# Patient Record
Sex: Female | Born: 1960 | Race: White | Hispanic: No | Marital: Married | Smoking: Never smoker
Health system: Southern US, Community
[De-identification: ages and names within clinical notes are randomized; demographics above are authoritative.]

## PROBLEM LIST (undated history)

## (undated) HISTORY — PX: CHOLECYSTECTOMY: SHX55

## (undated) HISTORY — PX: ABDOMINAL HYSTERECTOMY: SHX81

---

## 2004-05-25 ENCOUNTER — Ambulatory Visit (HOSPITAL_BASED_OUTPATIENT_CLINIC_OR_DEPARTMENT_OTHER): Admission: RE | Admit: 2004-05-25 | Discharge: 2004-05-25 | Payer: Self-pay | Admitting: Orthopedic Surgery

## 2005-07-27 ENCOUNTER — Emergency Department (HOSPITAL_COMMUNITY): Admission: EM | Admit: 2005-07-27 | Discharge: 2005-07-27 | Payer: Self-pay | Admitting: Emergency Medicine

## 2007-01-26 IMAGING — CT CT ABDOMEN W/O CM
1 of 3 series · 15 of 32 positions shown, 19 images · IV contrast (agent unspecified)
Comparison: none

***DUPLICATE COPY for exam association in [facility] -- No change from original report, 07/31/05.***
CLINICAL DATA: Abdominal pain.  Right lower quadrant pain.  Previous appendectomy and cholecystectomy.
 ABDOMEN CT WITHOUT CONTRAST:
TECHNIQUE: Multidetector CT imaging of the abdomen was performed following the standard protocol without IV contrast.
TECHNIQUE: Multidetector CT imaging of the pelvis was performed following the standard protocol without IV contrast.

[Series 4: thin section 2.0 b40f st · axial · 0.81mm/px · z∈[-465,-27]mm · 15 of 680 slices shown, 19 images]
[im 27/680  soft-tissue]
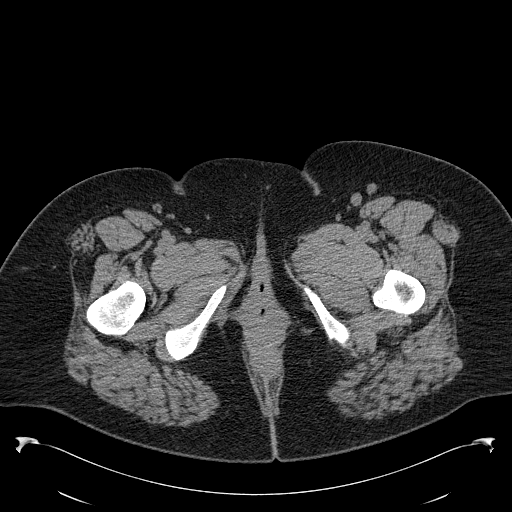
[im 27/680  bone]
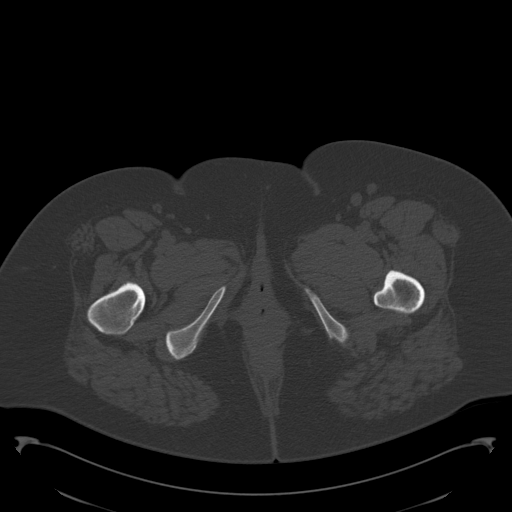
[im 79/680  soft-tissue]
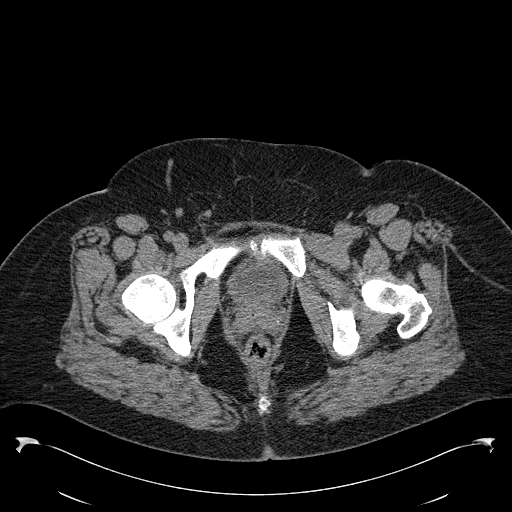
[im 131/680  soft-tissue]
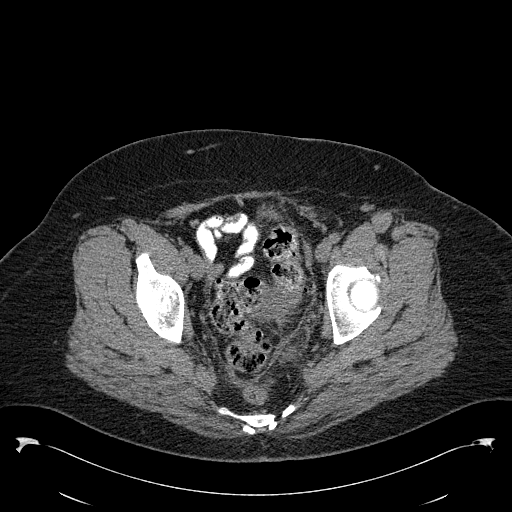
[im 183/680  soft-tissue]
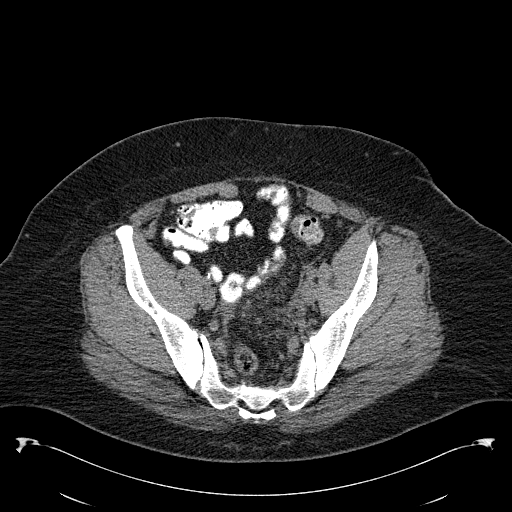
[im 236/680  soft-tissue]
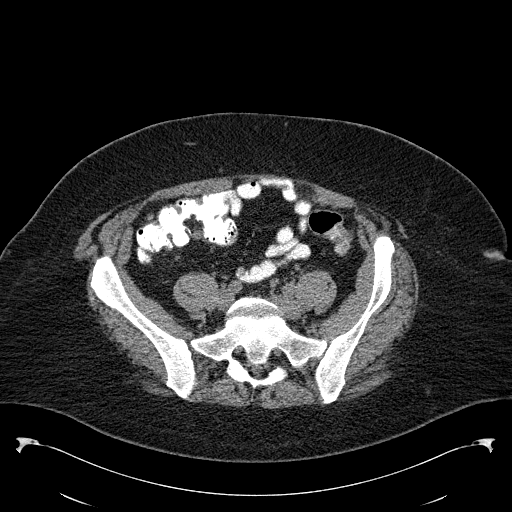
[im 288/680  soft-tissue]
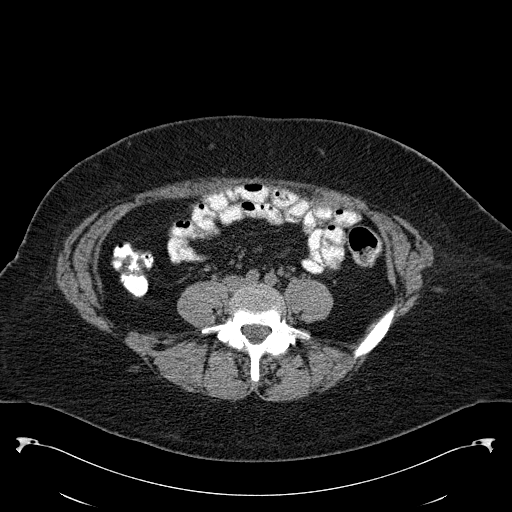
[im 340/680  soft-tissue]
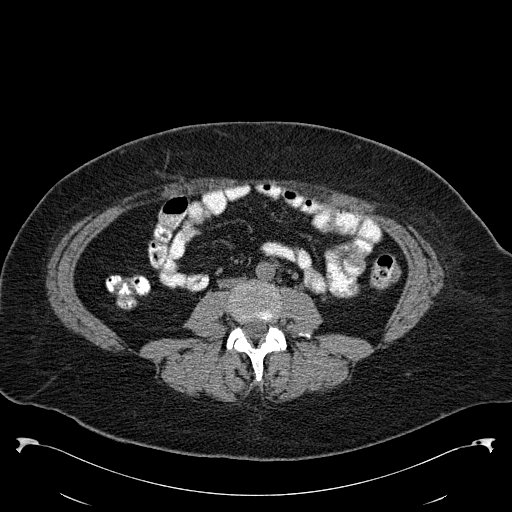
[im 392/680  soft-tissue]
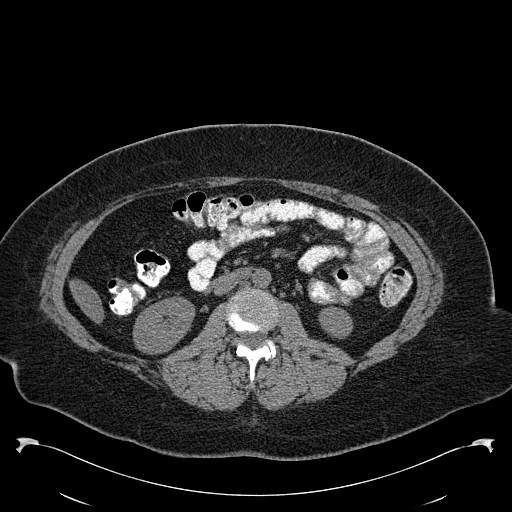
[im 444/680  soft-tissue]
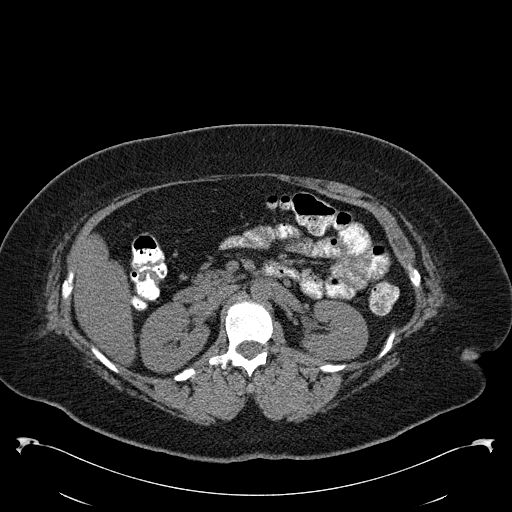
[im 444/680  bone]
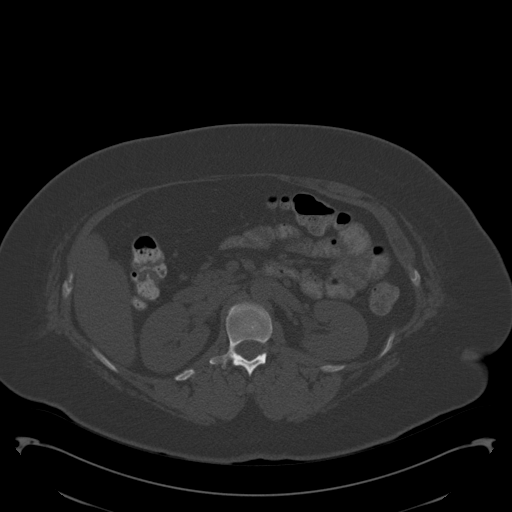
[im 497/680  soft-tissue]
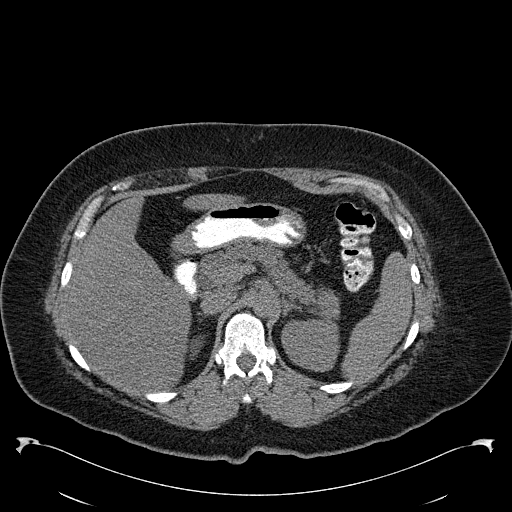
[im 549/680  soft-tissue]
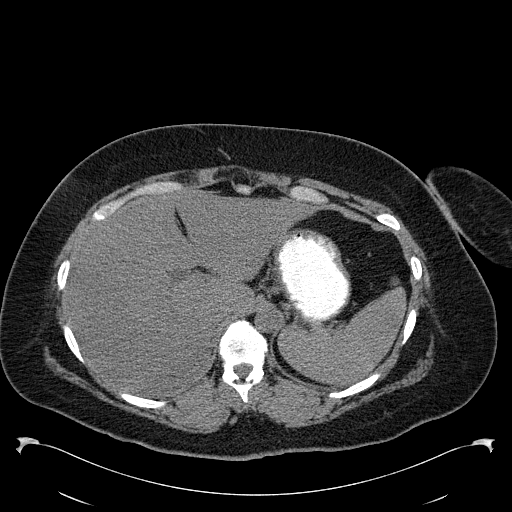
[im 575/680  lung]
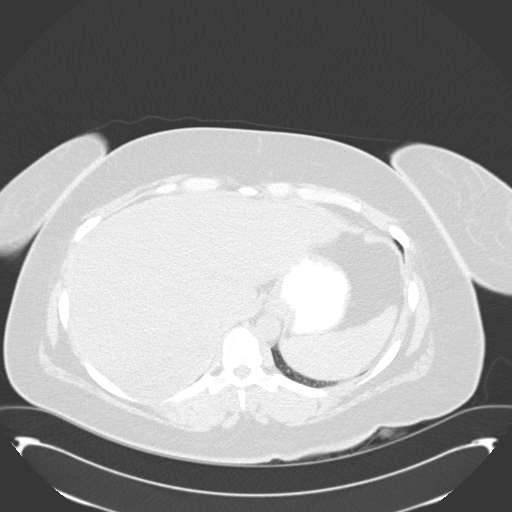
[im 601/680  soft-tissue]
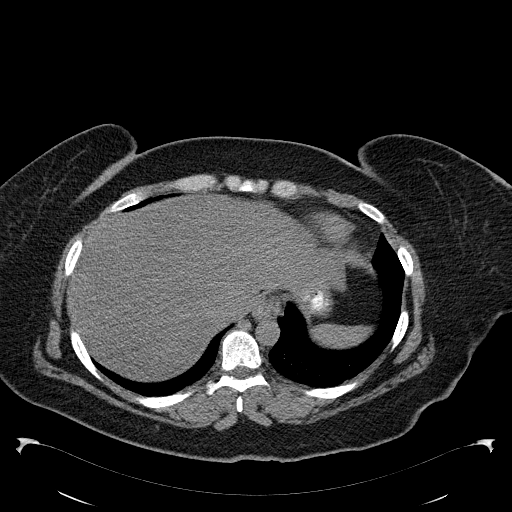
[im 601/680  lung]
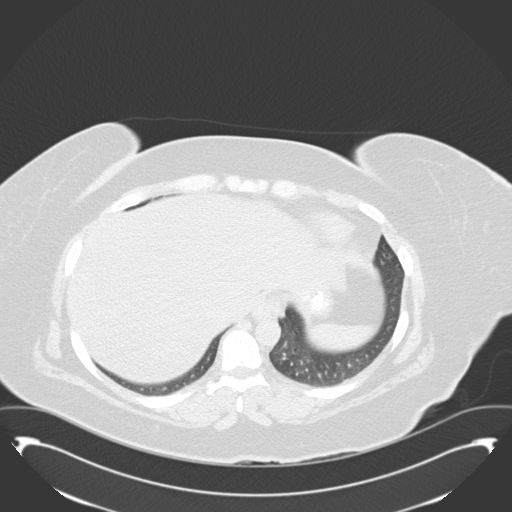
[im 627/680  lung]
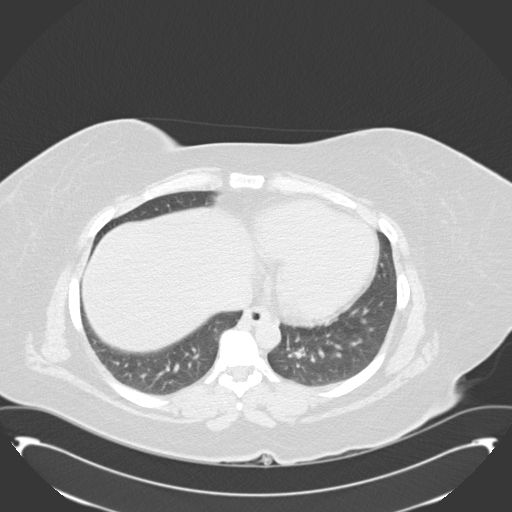
[im 653/680  soft-tissue]
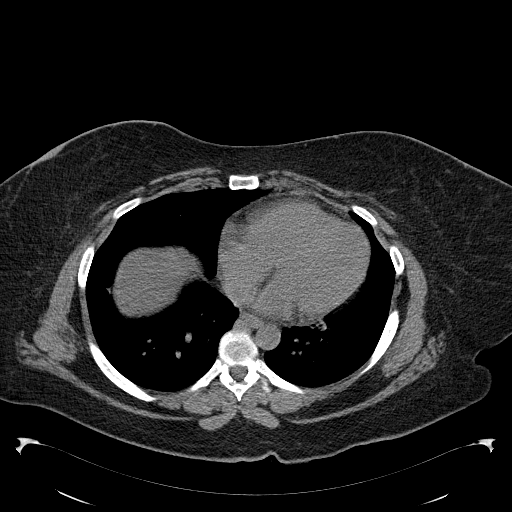
[im 653/680  lung]
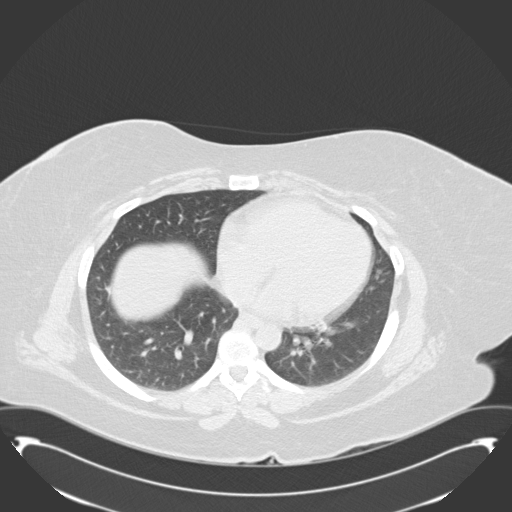

[15 of 32 positions shown; findings below may reference images not displayed]

FINDINGS: Lung bases clear.  The liver, spleen, and pancreas are within normal limits.  There is no obvious biliary ductal dilatation.  The gallbladder has been removed.  Adrenal glands and kidneys are unremarkable.  Small and large bowel appear normal.  There is no adenopathy.  Aorta and inferior vena cava are unremarkable.
IMPRESSION: Negative CT abdomen.
 PELVIS CT WITHOUT CONTRAST:
FINDINGS: Numerous diverticula are seen within the sigmoid region.  There is considerable surrounding inflammatory change consistent with acute diverticulitis of the rectosigmoid region.  I do not see a definite abscess.  There is no definite obstruction.  No pneumoperitoneum is seen.  No adenopathy is present.  No adnexal masses are seen.  No appendiceal abnormalities seen.
IMPRESSION: 1.  Sigmoid diverticulitis with moderate surrounding inflammatory change but no definite abscess.
 2.  No evidence for obstructive uropathy.

## 2011-03-11 ENCOUNTER — Inpatient Hospital Stay (INDEPENDENT_AMBULATORY_CARE_PROVIDER_SITE_OTHER)
Admission: RE | Admit: 2011-03-11 | Discharge: 2011-03-11 | Disposition: A | Discharge status: Home / Self Care | Payer: BC Managed Care – PPO | Source: Ambulatory Visit | Attending: Emergency Medicine | Admitting: Emergency Medicine

## 2011-03-11 DIAGNOSIS — I209 Angina pectoris, unspecified: Secondary | ICD-10-CM

## 2011-03-15 ENCOUNTER — Emergency Department (HOSPITAL_COMMUNITY): Payer: BC Managed Care – PPO

## 2011-03-15 ENCOUNTER — Emergency Department (HOSPITAL_COMMUNITY)
Admission: EM | Admit: 2011-03-15 | Discharge: 2011-03-16 | Disposition: A | Discharge status: Home / Self Care | Payer: BC Managed Care – PPO | Attending: Emergency Medicine | Admitting: Emergency Medicine

## 2011-03-15 DIAGNOSIS — R002 Palpitations: Secondary | ICD-10-CM | POA: Insufficient documentation

## 2011-03-15 DIAGNOSIS — R0602 Shortness of breath: Secondary | ICD-10-CM | POA: Insufficient documentation

## 2011-03-15 DIAGNOSIS — M79609 Pain in unspecified limb: Secondary | ICD-10-CM | POA: Insufficient documentation

## 2011-03-15 DIAGNOSIS — R29898 Other symptoms and signs involving the musculoskeletal system: Secondary | ICD-10-CM | POA: Insufficient documentation

## 2011-03-15 DIAGNOSIS — R209 Unspecified disturbances of skin sensation: Secondary | ICD-10-CM | POA: Insufficient documentation

## 2011-03-15 DIAGNOSIS — R079 Chest pain, unspecified: Secondary | ICD-10-CM | POA: Insufficient documentation

## 2011-03-15 DIAGNOSIS — M25476 Effusion, unspecified foot: Secondary | ICD-10-CM | POA: Insufficient documentation

## 2011-03-15 DIAGNOSIS — M25473 Effusion, unspecified ankle: Secondary | ICD-10-CM | POA: Insufficient documentation

## 2011-03-15 LAB — TROPONIN I: Troponin I: <0.3 ng/mL (ref <0.30)

## 2011-03-15 LAB — CBC
HCT: 42.9 % (ref 36.0–46.0)
MCH: 30.8 pg (ref 26.0–34.0)
MCHC: 34 g/dL (ref 30.0–36.0)
MCV: 90.5 fL (ref 78.0–100.0)
Platelets: 251 10*3/uL (ref 150–400)
RBC: 4.74 MIL/uL (ref 3.87–5.11)
RDW: 12.8 % (ref 11.5–15.5)
WBC: 11.6 10*3/uL — ABNORMAL HIGH (ref 4.0–10.5)

## 2011-03-15 LAB — BASIC METABOLIC PANEL
BUN: 14 mg/dL (ref 6–23)
CO2: 26 mEq/L (ref 19–32)
Calcium: 8.8 mg/dL (ref 8.4–10.5)
Chloride: 101 mEq/L (ref 96–112)
GFR calc Af Amer: >60 mL/min (ref >60)
GFR calc non Af Amer: >60 mL/min (ref >60)
Glucose, Bld: 115 mg/dL — ABNORMAL HIGH (ref 70–99)
Potassium: 4.1 mEq/L (ref 3.5–5.1)
Sodium: 137 mEq/L (ref 135–145)

## 2011-03-15 LAB — DIFFERENTIAL
Basophils Absolute: 0.1 10*3/uL (ref 0.0–0.1)
Basophils Relative: 1 % (ref 0–1)
Eosinophils Absolute: 0.2 10*3/uL (ref 0.0–0.7)
Eosinophils Relative: 2 % (ref 0–5)
Lymphocytes Relative: 35 % (ref 12–46)
Lymphs Abs: 4 10*3/uL (ref 0.7–4.0)
Monocytes Absolute: 1.1 10*3/uL — ABNORMAL HIGH (ref 0.1–1.0)
Monocytes Relative: 9 % (ref 3–12)
Neutro Abs: 6.2 10*3/uL (ref 1.7–7.7)

## 2011-03-15 LAB — CK TOTAL AND CKMB (NOT AT ARMC)
Relative Index: 1.7 (ref 0.0–2.5)
Total CK: 117 U/L (ref 7–177)

## 2011-03-16 LAB — TSH: TSH: 1.119 u[IU]/mL (ref 0.350–4.500)

## 2011-03-16 LAB — CK TOTAL AND CKMB (NOT AT ARMC)
CK, MB: 1.8 ng/mL (ref 0.3–4.0)
Relative Index: 1.5 (ref 0.0–2.5)
Total CK: 117 U/L (ref 7–177)

## 2011-03-16 LAB — TROPONIN I: Troponin I: <0.3 ng/mL (ref <0.30)

## 2012-09-12 IMAGING — CR DG CHEST 2V
2 series · 2 of 2 positions shown · non-contrast
Comparison: None

CLINICAL DATA: Chest pain, shortness of breath, numbness in both
arms

CHEST - 2 VIEW

[w chest pa]
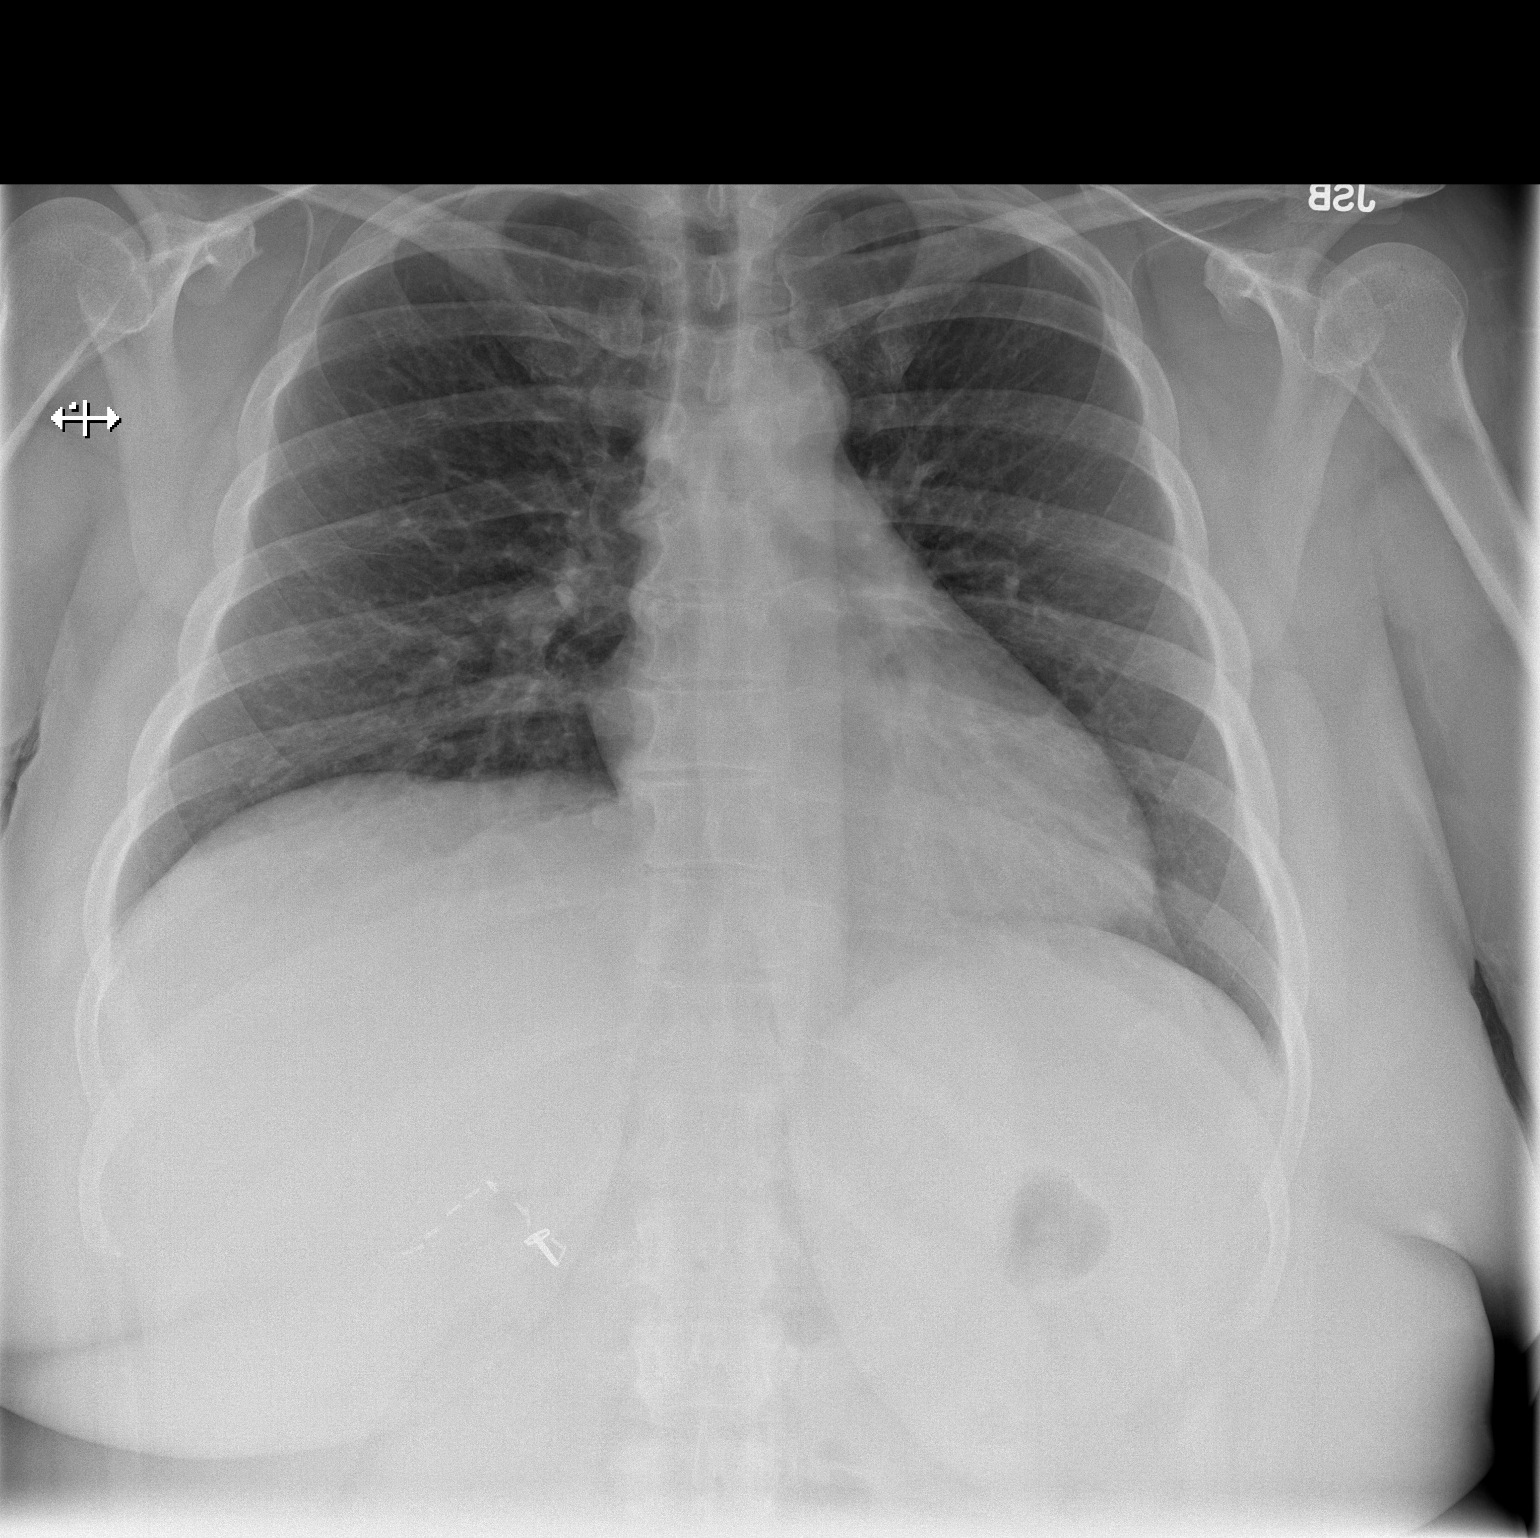

[w chest lat]
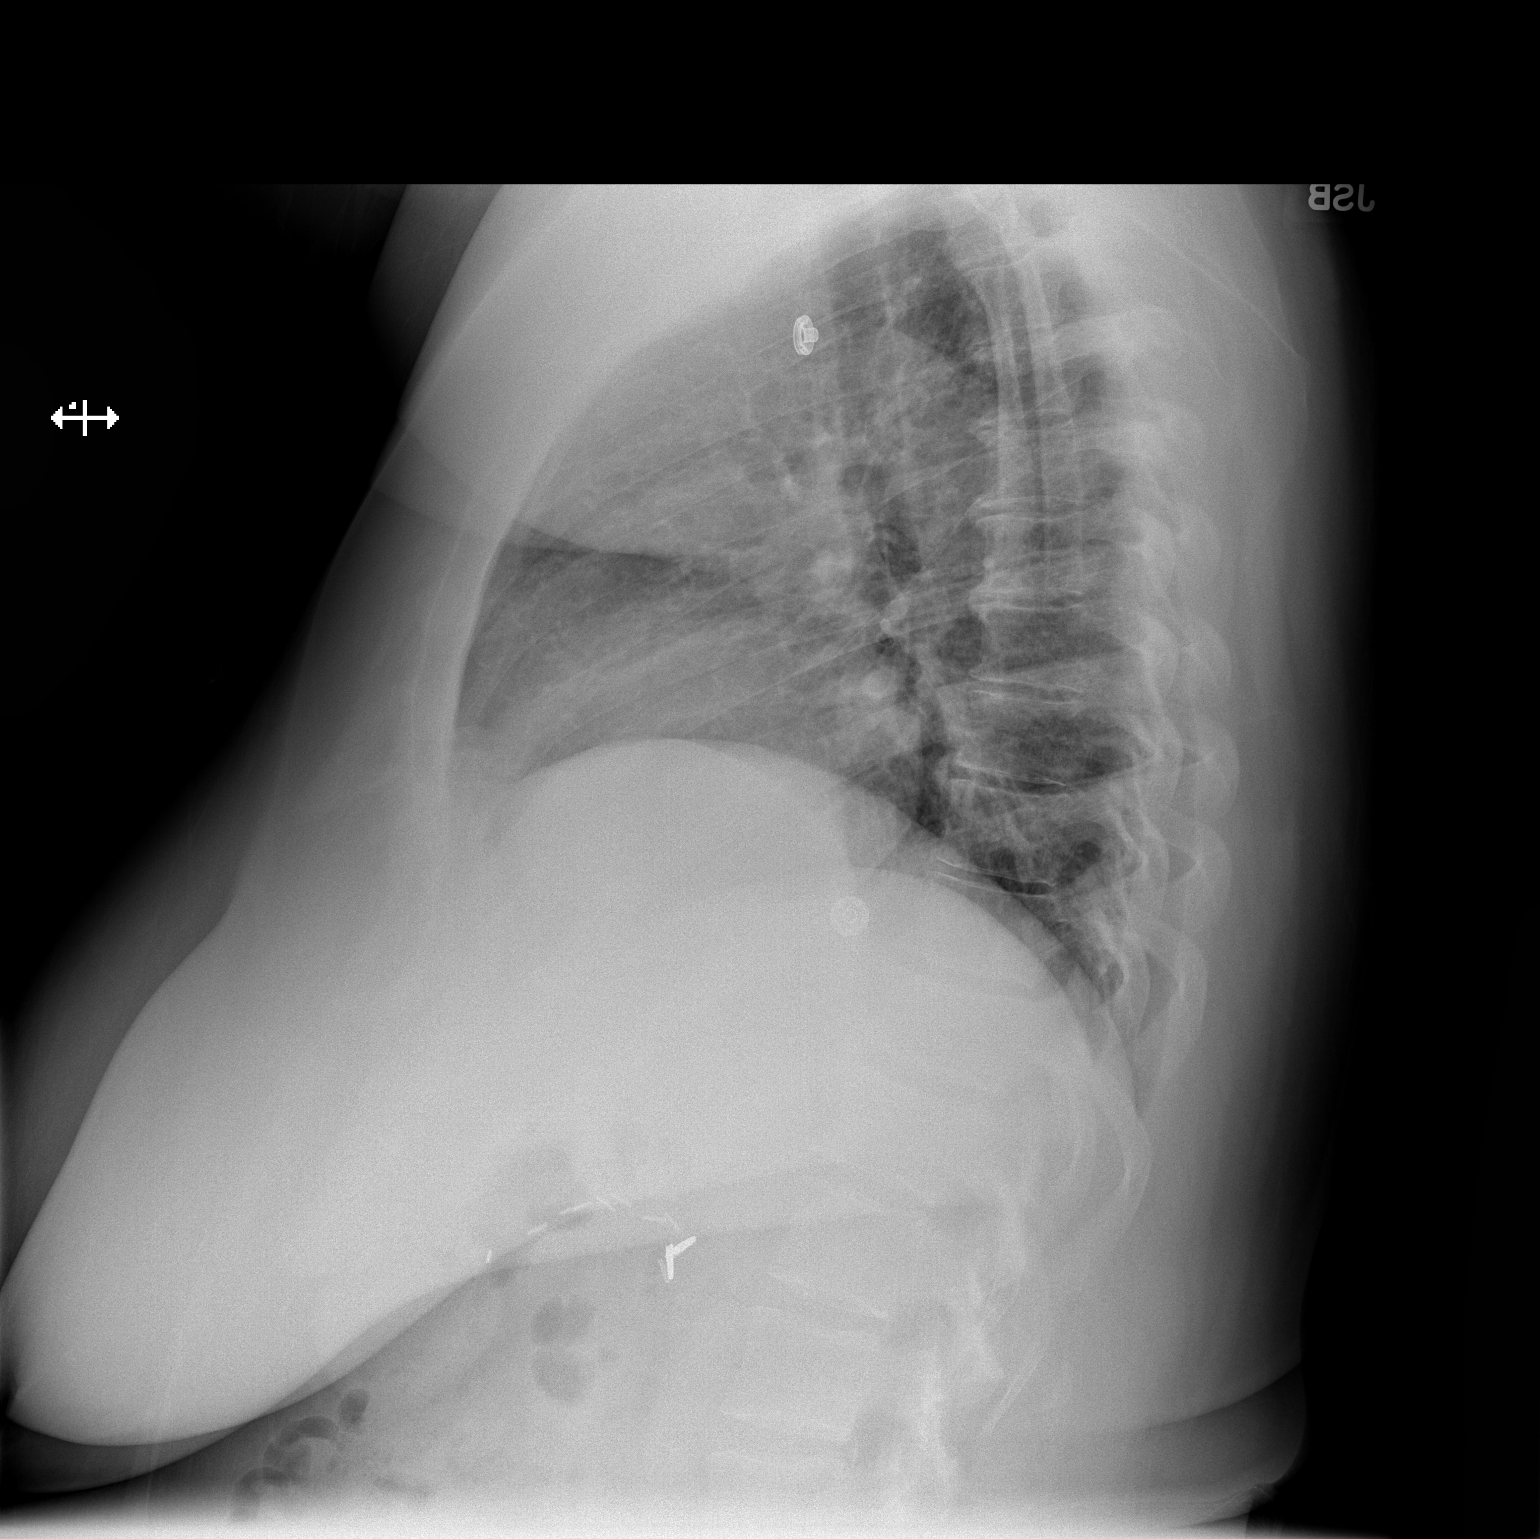

[2 of 2 positions shown; findings below may reference images not displayed]

FINDINGS: Upper-normal size of cardiac silhouette.
Mediastinal contours and pulmonary vascularity normal.
Lungs clear.
Slightly decreased lung volumes.
No pleural effusion or pneumothorax.
Scattered end plate spur formation thoracic spine.
IMPRESSION: Slightly decreased lung volumes.
No acute abnormalities otherwise seen.

## 2014-02-04 ENCOUNTER — Encounter (HOSPITAL_COMMUNITY): Payer: Self-pay | Admitting: Emergency Medicine

## 2014-02-04 ENCOUNTER — Emergency Department (INDEPENDENT_AMBULATORY_CARE_PROVIDER_SITE_OTHER)
Admission: EM | Admit: 2014-02-04 | Discharge: 2014-02-04 | Disposition: A | Discharge status: Home / Self Care | Payer: BC Managed Care – PPO | Source: Home / Self Care | Attending: Emergency Medicine | Admitting: Emergency Medicine

## 2014-02-04 DIAGNOSIS — T148 Other injury of unspecified body region: Secondary | ICD-10-CM

## 2014-02-04 DIAGNOSIS — W57XXXA Bitten or stung by nonvenomous insect and other nonvenomous arthropods, initial encounter: Secondary | ICD-10-CM

## 2014-02-04 DIAGNOSIS — L0291 Cutaneous abscess, unspecified: Secondary | ICD-10-CM

## 2014-02-04 DIAGNOSIS — L0390 Cellulitis, unspecified: Secondary | ICD-10-CM

## 2014-02-04 MED ORDER — CEPHALEXIN 500 MG PO CAPS: 500.0000 mg | ORAL_CAPSULE | Freq: Two times a day (BID) | ORAL | Status: AC

## 2014-02-04 NOTE — ED Notes (Signed)
Pt reports tick bite to right forearm onset this am 0600 Sx include localized fever, swelling, redness Denies fevers, weakness Alert w/no signs of acute distress.

## 2014-02-04 NOTE — ED Provider Notes (Signed)
CSN: [id number]     Arrival date & time 02/04/14  1806 History   First MD Initiated Contact with Patient 02/04/14 1912     Chief Complaint  Patient presents with  . Tick Removal   (Consider location/radiation/quality/duration/timing/severity/associated sxs/prior Treatment) HPI Comments: Patient pulled an embedded tick out this am. Throughout the day she noticed swelling and redness around the site. Mild discomfort. No fever or chills. No N or V.   The history is provided by the patient.    History reviewed. No pertinent past medical history. History reviewed. No pertinent past surgical history. No family history on file. History  Substance Use Topics  . Smoking status: Not on file  . Smokeless tobacco: Not on file  . Alcohol Use: Not on file   OB History   Grav Para Term Preterm Abortions TAB SAB Ect Mult Living                 Review of Systems  All other systems reviewed and are negative.   Allergies  Review of patient's allergies indicates no known allergies.  Home Medications   Prior to Admission medications   Medication Sig Start Date End Date Taking? Authorizing Provider  cephALEXin (KEFLEX) 500 MG capsule Take 1 capsule (500 mg total) by mouth 2 (two) times daily. 02/04/14 02/09/14  Riki Sheer, PA-C   There were no vitals taken for this visit. Physical Exam  Nursing note and vitals reviewed. Constitutional: She is oriented to person, place, and time. She appears well-developed and well-nourished. No distress.  Pulmonary/Chest: Effort normal.  Neurological: She is alert and oriented to person, place, and time.  Skin: Skin is warm and dry. She is not diaphoretic. There is erythema.  Right anterior forearm with small pin sized wound, no evidence of tick remains/head. Mild localized swelling, warmth and erythema approx 6cm wide along the area.   Psychiatric: Her behavior is normal.    ED Course  Procedures (including critical care time) Labs Review Labs  Reviewed - No data to display  Imaging Review No results found.   MDM   1. Tick bite   2. Cellulitis    Had been embedded. Probable inflammation, though cover for mild cellulitis with antibiotics. F/U should she have any signs of worsening infections or fevers.     Riki Sheer, PA-C 02/04/14 1926

## 2014-02-04 NOTE — Discharge Instructions (Signed)
Cellulitis Cellulitis is an infection of the skin and the tissue under the skin. The infected area is usually red and tender. This happens most often in the arms and lower legs. HOME CARE   Take your antibiotic medicine as told. Finish the medicine even if you start to feel better.  Keep the infected arm or leg raised (elevated).  Put a warm cloth on the area up to 4 times per day.  Only take medicines as told by your doctor.  Keep all doctor visits as told. GET HELP RIGHT AWAY IF:   You have a fever.  You feel very sleepy.  You throw up (vomit) or have watery poop (diarrhea).  You feel sick and have muscle aches and pains.  You see red streaks on the skin coming from the infected area.  Your red area gets bigger or turns a dark color.  Your bone or joint under the infected area is painful after the skin heals.  Your infection comes back in the same area or different area.  You have a puffy (swollen) bump in the infected area.  You have new symptoms. MAKE SURE YOU:   Understand these instructions.  Will watch your condition.  Will get help right away if you are not doing well or get worse. Document Released: 02/19/2008 Document Revised: 03/03/2012 Document Reviewed: 11/18/2011 [facility] Patient Information 2014 [place], [place].   Mild skin infection/inflammation following tick bite. Covering with antibiotic to prevent infection. Should you have signs of worsening symptoms please return in f/u. Keep clean. Nice to meet you.

## 2014-02-06 NOTE — ED Provider Notes (Signed)
Medical screening examination/treatment/procedure(s) were performed by non-physician practitioner and as supervising physician I was immediately available for consultation/collaboration.  Leslee Home, M.D.  Reuben Likes, MD 02/06/14 [phone]

## 2015-05-24 ENCOUNTER — Other Ambulatory Visit: Payer: Self-pay | Admitting: *Deleted

## 2015-05-24 DIAGNOSIS — I83813 Varicose veins of bilateral lower extremities with pain: Secondary | ICD-10-CM

## 2015-07-28 ENCOUNTER — Encounter: Payer: Self-pay | Admitting: Vascular Surgery

## 2015-07-28 ENCOUNTER — Encounter (HOSPITAL_COMMUNITY): Payer: Self-pay

## 2015-07-31 ENCOUNTER — Encounter: Payer: Self-pay | Admitting: Surgery

## 2015-07-31 ENCOUNTER — Encounter (HOSPITAL_COMMUNITY): Payer: Self-pay

## 2015-08-01 ENCOUNTER — Encounter: Payer: Self-pay | Admitting: Vascular Surgery

## 2015-08-04 ENCOUNTER — Ambulatory Visit (INDEPENDENT_AMBULATORY_CARE_PROVIDER_SITE_OTHER): Payer: BLUE CROSS/BLUE SHIELD | Admitting: Vascular Surgery

## 2015-08-04 ENCOUNTER — Encounter: Payer: Self-pay | Admitting: Vascular Surgery

## 2015-08-04 ENCOUNTER — Ambulatory Visit (HOSPITAL_COMMUNITY)
Admission: RE | Admit: 2015-08-04 | Discharge: 2015-08-04 | Disposition: A | Discharge status: Home / Self Care | Payer: BLUE CROSS/BLUE SHIELD | Source: Ambulatory Visit | Attending: Vascular Surgery | Admitting: Vascular Surgery

## 2015-08-04 VITALS — BP 133/87 | HR 56 | Wt 232.0 lb

## 2015-08-04 DIAGNOSIS — I83813 Varicose veins of bilateral lower extremities with pain: Secondary | ICD-10-CM | POA: Diagnosis present

## 2015-08-04 DIAGNOSIS — I872 Venous insufficiency (chronic) (peripheral): Secondary | ICD-10-CM

## 2015-08-04 DIAGNOSIS — I83899 Varicose veins of unspecified lower extremities with other complications: Secondary | ICD-10-CM | POA: Insufficient documentation

## 2015-08-04 DIAGNOSIS — I83893 Varicose veins of bilateral lower extremities with other complications: Secondary | ICD-10-CM

## 2015-08-04 NOTE — Progress Notes (Signed)
Referred by:  Fransisco Hertz, MD [address] [place], [place] [address]  Reason for referral: B painful varicosities   History of Present Illness  Theresa Nunez is a 54 y.o. (1961/02/14) female who presents with chief complaint: L>R painful varicosities.  Patient notes, onset of swelling with vein buldging years ago, associated with pregnancy.  The patient's symptoms include: swelling in both legs with painful bulging veins, and bursting sensation.  The patient has had known history of DVT, known history of pregnancy, known history of varicose vein, no history of venous stasis ulcers, no history of  Lymphedema and known history of skin changes in lower legs.  There is known family history of venous disorders.  The patient has used knee high compression stockings in the past.   History reviewed. No pertinent past medical history.  Past Surgical History  Procedure Laterality Date  . Abdominal hysterectomy    . Cholecystectomy      Social History   Social History  . Marital Status: Married    Spouse Name: N/A  . Number of Children: N/A  . Years of Education: N/A   Occupational History  . Not on file.   Social History Main Topics  . Smoking status: Never Smoker   . Smokeless tobacco: Not on file  . Alcohol Use: No  . Drug Use: No  . Sexual Activity: Not on file   Other Topics Concern  . Not on file   Social History Narrative    Family History: unable to detail parents' medical history  Current Outpatient Prescriptions  Medication Sig Dispense Refill  . metFORMIN (GLUCOPHAGE) 500 MG tablet 1 tablet with meals     No current facility-administered medications for this visit.    Allergies  Allergen Reactions  . Iodine Other (See Comments)  . Latex Other (See Comments)  . Naproxen Other (See Comments)     REVIEW OF SYSTEMS:  (Positives checked otherwise negative)  CARDIOVASCULAR:    chest pain,   chest pressure,   palpitations,   shortness of  breath when laying flat,   shortness of breath with exertion,    pain in feet when walking,   pain in feet when laying flat,  history of blood clot in veins (DVT),   history of phlebitis,   swelling in legs,   varicose veins  PULMONARY:    productive cough,   asthma,   wheezing  NEUROLOGIC:    weakness in arms or legs,   numbness in arms or legs,   difficulty speaking or slurred speech,   temporary loss of vision in one eye,   dizziness  HEMATOLOGIC:    bleeding problems,   problems with blood clotting too easily  MUSCULOSKEL:    joint pain,  joint swelling  GASTROINTEST:    vomiting blood,   blood in stool     GENITOURINARY:    burning with urination,   blood in urine  PSYCHIATRIC:    history of major depression  INTEGUMENTARY:    rashes,   ulcers  CONSTITUTIONAL:    fever,   chills   Physical Examination  Filed Vitals:   08/04/15 1517  BP: 133/87  Pulse: 56  Weight: 232 lb (105.235 kg)  SpO2: 95%   There is no height on file to calculate BMI.  General: A&O  x 3, WD, obese  Head: [place]/AT  Ear/Nose/Throat: Hearing grossly intact, nares without erythema or drainage, oropharynx without Erythema/Exudate, Mallampati score: 3  Eyes: PERRLA, EOMI  Neck: Supple, no nuchal rigidity, no palpable LAD  Pulmonary: Sym exp, good air movt, CTAB, no rales, rhonchi, & wheezing  Cardiac: RRR, Nl S1, S2, no Murmurs, rubs or gallops  Vascular: Vessel Right Left  Radial Palpable Palpable  Brachial Palpable Palpable  Carotid Palpable, without bruit Palpable, without bruit  Aorta Not palpable N/A  Femoral Palpable Palpable  Popliteal Not palpable Not palpable  PT Palpable Palpable  DP Palpable Palpable   Gastrointestinal: soft, NTND, no G/R, no HSM, no masses, no CVAT B  Musculoskeletal: M/S 5/5 throughout , Extremities without ischemic changes , B LDS, B edema 1-2+, large  varicosities bilaterally with L  Medial VV TTP  Neurologic: CN 2-12 intact , Pain and light touch intact in extremities , Motor exam as listed above  Psychiatric: Judgment intact, Mood & affect appropriate for pt's clinical situation  Dermatologic: See M/S exam for extremity exam, no rashes otherwise noted  Lymph : No Cervical, Axillary, or Inguinal lymphadenopathy    Non-Invasive Vascular Imaging  BLE Venous Insufficiency Duplex (Date: 08/04/2015):   RLE:   no DVT and SVT,   + GSV reflux,   no SSV reflux,  + deep venous reflux: SFV  LLE:  no DVT and SVT,   + GSV reflux,   no SSV reflux,  + deep venous reflux: CFV, SFV   Outside Studies/Documentation 4 pages of outside documents were reviewed including: outpatient PCP notes.   Medical Decision Making  Alonza SmokerLaura Anthes is a 54 y.o. female who presents with: BLE chronic venous insufficiency (C4), B varicose veins with complications   Based on the patient's history and examination, I recommend: compressive therapy.  I discussed with the patient the use of her 20-30 mm thigh high compression stockings and need for 3 month trial of such.  The patient will follow up in 3 months with my [facility] for evaluation for: EVLA L GSV followed by EVLA R GSV.  Thank you for allowing us to participate in this patient's care.   Leonides SakeBrian Chen, MD [facility] Office: [phone] Pager: [phone]  08/04/2015, 4:50 PM

## 2015-08-08 ENCOUNTER — Encounter: Payer: Self-pay | Admitting: Family Medicine

## 2015-11-03 ENCOUNTER — Encounter: Payer: Self-pay | Admitting: Vascular Surgery

## 2015-11-14 ENCOUNTER — Ambulatory Visit: Payer: BLUE CROSS/BLUE SHIELD | Admitting: Vascular Surgery

## 2023-09-17 DIAGNOSIS — Z419 Encounter for procedure for purposes other than remedying health state, unspecified: Secondary | ICD-10-CM | POA: Diagnosis not present

## 2023-10-18 DIAGNOSIS — Z419 Encounter for procedure for purposes other than remedying health state, unspecified: Secondary | ICD-10-CM | POA: Diagnosis not present

## 2023-11-15 DIAGNOSIS — Z419 Encounter for procedure for purposes other than remedying health state, unspecified: Secondary | ICD-10-CM | POA: Diagnosis not present

## 2023-12-27 DIAGNOSIS — Z419 Encounter for procedure for purposes other than remedying health state, unspecified: Secondary | ICD-10-CM | POA: Diagnosis not present

## 2024-01-26 DIAGNOSIS — Z419 Encounter for procedure for purposes other than remedying health state, unspecified: Secondary | ICD-10-CM | POA: Diagnosis not present

## 2024-02-26 DIAGNOSIS — Z419 Encounter for procedure for purposes other than remedying health state, unspecified: Secondary | ICD-10-CM | POA: Diagnosis not present

## 2024-03-27 DIAGNOSIS — Z419 Encounter for procedure for purposes other than remedying health state, unspecified: Secondary | ICD-10-CM | POA: Diagnosis not present

## 2024-04-27 DIAGNOSIS — Z419 Encounter for procedure for purposes other than remedying health state, unspecified: Secondary | ICD-10-CM | POA: Diagnosis not present

## 2024-05-28 DIAGNOSIS — Z419 Encounter for procedure for purposes other than remedying health state, unspecified: Secondary | ICD-10-CM | POA: Diagnosis not present

## 2024-06-27 DIAGNOSIS — Z419 Encounter for procedure for purposes other than remedying health state, unspecified: Secondary | ICD-10-CM | POA: Diagnosis not present
